# Patient Record
Sex: Female | Born: 1960 | Race: Black or African American | Hispanic: No | Smoking: Current every day smoker
Health system: Southern US, Community
[De-identification: ages and names within clinical notes are randomized; demographics above are authoritative.]

## PROBLEM LIST (undated history)

## (undated) DIAGNOSIS — B1920 Unspecified viral hepatitis C without hepatic coma: Secondary | ICD-10-CM

## (undated) DIAGNOSIS — K589 Irritable bowel syndrome without diarrhea: Secondary | ICD-10-CM

## (undated) DIAGNOSIS — J40 Bronchitis, not specified as acute or chronic: Secondary | ICD-10-CM

---

## 1998-11-06 ENCOUNTER — Emergency Department (HOSPITAL_COMMUNITY): Admission: EM | Admit: 1998-11-06 | Discharge: 1998-11-06 | Payer: Self-pay | Admitting: Emergency Medicine

## 1998-11-10 ENCOUNTER — Inpatient Hospital Stay (HOSPITAL_COMMUNITY): Admission: AD | Admit: 1998-11-10 | Discharge: 1998-11-10 | Payer: Self-pay | Admitting: *Deleted

## 1999-02-25 ENCOUNTER — Emergency Department (HOSPITAL_COMMUNITY): Admission: EM | Admit: 1999-02-25 | Discharge: 1999-02-25 | Payer: Self-pay | Admitting: Emergency Medicine

## 1999-03-20 ENCOUNTER — Emergency Department (HOSPITAL_COMMUNITY): Admission: EM | Admit: 1999-03-20 | Discharge: 1999-03-20 | Payer: Self-pay | Admitting: Emergency Medicine

## 1999-03-20 ENCOUNTER — Encounter: Payer: Self-pay | Admitting: Emergency Medicine

## 1999-03-26 ENCOUNTER — Encounter: Admission: RE | Admit: 1999-03-26 | Discharge: 1999-03-26 | Payer: Self-pay | Admitting: Family Medicine

## 1999-03-27 ENCOUNTER — Encounter: Payer: Self-pay | Admitting: Emergency Medicine

## 1999-03-27 ENCOUNTER — Emergency Department (HOSPITAL_COMMUNITY): Admission: EM | Admit: 1999-03-27 | Discharge: 1999-03-27 | Payer: Self-pay | Admitting: Emergency Medicine

## 1999-11-19 ENCOUNTER — Inpatient Hospital Stay (HOSPITAL_COMMUNITY): Admission: AD | Admit: 1999-11-19 | Discharge: 1999-11-22 | Payer: Self-pay | Admitting: Obstetrics & Gynecology

## 1999-11-19 ENCOUNTER — Encounter: Payer: Self-pay | Admitting: Obstetrics & Gynecology

## 1999-11-20 ENCOUNTER — Encounter: Payer: Self-pay | Admitting: Obstetrics

## 2000-10-12 ENCOUNTER — Encounter: Payer: Self-pay | Admitting: Internal Medicine

## 2000-10-12 ENCOUNTER — Ambulatory Visit (HOSPITAL_COMMUNITY): Admission: RE | Admit: 2000-10-12 | Discharge: 2000-10-12 | Payer: Self-pay | Admitting: Internal Medicine

## 2000-10-20 ENCOUNTER — Emergency Department (HOSPITAL_COMMUNITY): Admission: EM | Admit: 2000-10-20 | Discharge: 2000-10-20 | Payer: Self-pay | Admitting: Emergency Medicine

## 2000-12-01 ENCOUNTER — Ambulatory Visit (HOSPITAL_COMMUNITY): Admission: RE | Admit: 2000-12-01 | Discharge: 2000-12-01 | Payer: Self-pay | Admitting: Internal Medicine

## 2001-12-05 ENCOUNTER — Ambulatory Visit (HOSPITAL_COMMUNITY): Admission: RE | Admit: 2001-12-05 | Discharge: 2001-12-05 | Payer: Self-pay | Admitting: Family Medicine

## 2001-12-16 ENCOUNTER — Emergency Department (HOSPITAL_COMMUNITY): Admission: EM | Admit: 2001-12-16 | Discharge: 2001-12-17 | Payer: Self-pay | Admitting: Emergency Medicine

## 2001-12-17 ENCOUNTER — Encounter: Payer: Self-pay | Admitting: Emergency Medicine

## 2002-01-02 ENCOUNTER — Encounter: Payer: Self-pay | Admitting: Internal Medicine

## 2002-01-02 ENCOUNTER — Ambulatory Visit (HOSPITAL_COMMUNITY): Admission: RE | Admit: 2002-01-02 | Discharge: 2002-01-02 | Payer: Self-pay | Admitting: Internal Medicine

## 2002-03-16 ENCOUNTER — Encounter: Payer: Self-pay | Admitting: Neurosurgery

## 2002-03-16 ENCOUNTER — Ambulatory Visit (HOSPITAL_COMMUNITY): Admission: RE | Admit: 2002-03-16 | Discharge: 2002-03-17 | Payer: Self-pay | Admitting: Neurosurgery

## 2002-05-16 ENCOUNTER — Encounter: Payer: Self-pay | Admitting: Internal Medicine

## 2002-05-16 ENCOUNTER — Ambulatory Visit (HOSPITAL_COMMUNITY): Admission: RE | Admit: 2002-05-16 | Discharge: 2002-05-16 | Payer: Self-pay | Admitting: Internal Medicine

## 2002-05-19 ENCOUNTER — Encounter: Payer: Self-pay | Admitting: Internal Medicine

## 2002-05-19 ENCOUNTER — Ambulatory Visit (HOSPITAL_COMMUNITY): Admission: RE | Admit: 2002-05-19 | Discharge: 2002-05-19 | Payer: Self-pay | Admitting: Internal Medicine

## 2002-10-17 ENCOUNTER — Encounter: Admission: RE | Admit: 2002-10-17 | Discharge: 2002-10-24 | Payer: Self-pay | Admitting: Internal Medicine

## 2002-12-07 ENCOUNTER — Ambulatory Visit (HOSPITAL_COMMUNITY): Admission: RE | Admit: 2002-12-07 | Discharge: 2002-12-07 | Payer: Self-pay | Admitting: Family Medicine

## 2003-09-11 ENCOUNTER — Encounter: Admission: RE | Admit: 2003-09-11 | Discharge: 2003-09-11 | Payer: Self-pay | Admitting: Internal Medicine

## 2004-10-30 ENCOUNTER — Ambulatory Visit: Payer: Self-pay | Admitting: Internal Medicine

## 2004-11-18 ENCOUNTER — Encounter: Admission: RE | Admit: 2004-11-18 | Discharge: 2004-11-18 | Payer: Self-pay | Admitting: Internal Medicine

## 2005-01-07 ENCOUNTER — Ambulatory Visit: Payer: Self-pay | Admitting: Internal Medicine

## 2005-08-30 ENCOUNTER — Ambulatory Visit: Payer: Self-pay | Admitting: Internal Medicine

## 2005-11-22 ENCOUNTER — Ambulatory Visit: Payer: Self-pay | Admitting: Internal Medicine

## 2006-01-18 ENCOUNTER — Encounter: Admission: RE | Admit: 2006-01-18 | Discharge: 2006-01-18 | Payer: Self-pay | Admitting: Internal Medicine

## 2006-08-08 ENCOUNTER — Emergency Department (HOSPITAL_COMMUNITY): Admission: EM | Admit: 2006-08-08 | Discharge: 2006-08-08 | Payer: Self-pay | Admitting: Emergency Medicine

## 2007-07-05 ENCOUNTER — Ambulatory Visit: Payer: Self-pay | Admitting: Internal Medicine

## 2007-07-07 ENCOUNTER — Ambulatory Visit (HOSPITAL_COMMUNITY): Admission: RE | Admit: 2007-07-07 | Discharge: 2007-07-07 | Payer: Self-pay | Admitting: Internal Medicine

## 2007-07-13 ENCOUNTER — Ambulatory Visit: Payer: Self-pay | Admitting: Internal Medicine

## 2008-01-19 ENCOUNTER — Inpatient Hospital Stay (HOSPITAL_COMMUNITY): Admission: AD | Admit: 2008-01-19 | Discharge: 2008-01-19 | Payer: Self-pay | Admitting: Obstetrics & Gynecology

## 2008-06-29 ENCOUNTER — Emergency Department (HOSPITAL_COMMUNITY): Admission: EM | Admit: 2008-06-29 | Discharge: 2008-06-29 | Payer: Self-pay | Admitting: Emergency Medicine

## 2008-07-18 ENCOUNTER — Ambulatory Visit: Payer: Self-pay | Admitting: Family Medicine

## 2009-03-14 ENCOUNTER — Ambulatory Visit: Payer: Self-pay | Admitting: Internal Medicine

## 2009-04-02 ENCOUNTER — Ambulatory Visit: Payer: Self-pay | Admitting: Internal Medicine

## 2009-04-02 LAB — CONVERTED CEMR LAB
CO2: 18 meq/L — ABNORMAL LOW (ref 19–32)
Cholesterol: 247 mg/dL — ABNORMAL HIGH (ref 0–200)
Glucose, Bld: 84 mg/dL (ref 70–99)
HDL: 80 mg/dL (ref >39)
LDL Cholesterol: 145 mg/dL — ABNORMAL HIGH (ref 0–99)
Potassium: 4.3 meq/L (ref 3.5–5.3)
Sodium: 142 meq/L (ref 135–145)

## 2009-04-10 ENCOUNTER — Ambulatory Visit (HOSPITAL_COMMUNITY): Admission: RE | Admit: 2009-04-10 | Discharge: 2009-04-10 | Payer: Self-pay | Admitting: Internal Medicine

## 2010-03-16 ENCOUNTER — Emergency Department (HOSPITAL_COMMUNITY)
Admission: EM | Admit: 2010-03-16 | Discharge: 2010-03-16 | Payer: Self-pay | Source: Home / Self Care | Admitting: Emergency Medicine

## 2010-04-22 ENCOUNTER — Ambulatory Visit (HOSPITAL_COMMUNITY): Admission: RE | Admit: 2010-04-22 | Payer: Self-pay | Source: Home / Self Care | Admitting: Internal Medicine
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
## 2010-08-28 NOTE — Op Note (Signed)
NAME:  [name], [name]                   ACCOUNT NO.:  [id number]   MEDICAL RECORD NO.:  [id number]                   PATIENT TYPE:  OIB   LOCATION:  3008                                 FACILITY:  [facility]   PHYSICIAN:  Kathaleen Maser. Pool, M.D.                 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   POSTOPERATIVE DIAGNOSES:  1. Right L4-5 herniated nucleus pulposus with radiculopathy.  2. 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   PROCEDURE:  Right L4-5 laminotomy and microdiskectomy.   SURGEON:  Kathaleen Maser. Pool, M.D.   ASSISTANT:  Reinaldo Meeker, M.D.   ANESTHESIA:  General endotracheal.   INDICATION FOR PROCEDURE:  The patient is a 49 year old female with history  of back and right lower extremity pain, paresthesias, and weakness with a  right-sided L5 radiculopathy, which has failed conservative management.  MRI  scanning demonstrates evidence of a small focal right-sided L4-5 disk  herniation with compression of the proximal aspect of the right-sided L5  nerve root.  The patient has been counseled as to her options.  She has  decided to proceed with a right-sided L4-5 laminotomy and microdiskectomy  for hopeful improvement of her symptoms.   DESCRIPTION OF PROCEDURE:  The patient was taken to the operating room and  placed on the operating table in a supine position.  After an adequate level  of anesthesia was achieved, the patient was positioned prone onto the Wilson  frame, appropriately padded.  The patient's lumbar region was prepped and  draped sterilely.  A 10 blade was used to make a linear skin incision  overlying the L4-5 interspace.  This was carried down sharply in the  midline.  A subperiosteal dissection was then performed, exposing the  laminae and facet joints of L4 and L5 on  the right.  A deep self-retaining  retractor was placed, intraoperative x-ray was taken, and the level was  confirmed.  A laminotomy was then performed using a high-speed drill and  Kerrison rongeurs to remove the inferior one-half of the lamina of L4,  medial edge of the L4-5 facet joint, and superior rim of the L5 lamina.  The  ligamentum flavum was then elevated and resected in piecemeal fashion using  Kerrison rongeurs until the underlying thecal sac was identified.  The  microscope was brought into the field and using microdissection of the right-  sided nerve root and underlying disk herniation.  During dissection along  the right-sided L5 nerve root, it was obvious that the anatomy was abnormal.  This was dissected out further, and it became clear that the patient had a  conjoined nerve root sleeve involving the L4 and L5 nerve roots.  The space  between the two nerve roots was developed  and the nerve roots themselves  were retracted gently toward the midline.  The focal disk herniation was  readily apparent.  This was then incised with a 15 blade in a rectangular  fashion.  A wide disk space cleanout was then achieved using pituitary  rongeurs, upward-angled pituitary rongeurs, and Epstein curettes.  All loose  or obviously degenerative disk material was removed from the interspace.  All elements of the disk herniation were completely resected.  At this point  both the L4 and L5 nerve roots were free of any compression.  The wound was  then irrigated with antibiotic solution.  Gelfoam was placed topically for  hemostasis, which was found to be good.  The microscope and retractor system  were removed.  Hemostasis in the muscle was achieved with electrocautery.  The wound was then closed in layers with Vicryl sutures.  Steri-Strips and a  sterile dressing were applied. There were no apparent complications.  The  patient tolerated the procedure well, and she returns to the recovery  room  postop.                                                Henry A. Pool, M.D.    HAP/MEDQ  D:  03/16/2002  T:  03/16/2002  Job:  [id number]

## 2010-08-28 NOTE — Discharge Summary (Signed)
[facility]  Patient:    [name], [name]                  MRN: [id number] Adm. Date:  [id number] Disc. Date: [id number] Attending:  Conley Simmonds A                           Discharge Summary  ADMISSION DIAGNOSIS:          Abdominal pain.  DISCHARGE DIAGNOSES:          1. Abdominal pain.                               2. Pelvic abscess.  HISTORY OF PRESENT ILLNESS:   The patient is a 50 year old, gravida 2, para 1, status post TAH and LSO in April of 2001 at [facility] for fibroids and ovarian cyst.  She presented with onset of right abdominal pain on November 18, 1999, that morning, which had been steadily worsening.  She reported that the pain was 8/10 and worse with increasing activity and hurts after she voids. She denied any fever.  She did report some nausea, but denied any vomiting. Per patient, she was in the hospital for approximately three weeks after  her surgery in April at [facility]  Her postoperative course was complicated by what sounds like a postoperative ileus, as well as a postoperative cuff abscess, which was drained, and she was on antibiotics for a prolonged period of time.  PHYSICAL EXAMINATION:         The patient was noted on admission to be afebrile with stable vital signs.  Her physical exam on admission was remarkable for some right lower quadrant tenderness to light palpation.  No rebound, but some guarding.  There was no palpable mass.  However, on bimanual exam, there was a 7-8 cm right adnexal mass which was tender and nonmobile.  LABORATORY DATA:              Her white blood cell count was 8.5 on admission and the hematocrit was 41.2.  The wet prep was negative.  Her urinalysis was also negative.  A pelvic ultrasound revealed a 7.7 cm multiloculated cyst in the right adnexa, which was considered to be an abscess versus a neoplasm.  HOSPITAL COURSE:              The patient was admitted, given  pain medications, and initially placed NPO.  Because the patients initial surgery was performed at [facility], the gynecologic service was contacted there and they desired the patient to be transferred to their service.  The patient was transferred on hospital day #3.  The delay was secondary to waiting for an available bed at [facility].  During her entire hospitalization, the patient remained afebrile with stable vital signs.  Her white count did not become elevated.  She was actually maintained on oral pain medications.  DISPOSITION:                  The patient was transferred on hospital day #3 to the care of the GYN service of Dr. ______ with the diagnosis of probable right adnexal abscess.  She was in stable condition at the time of transfer. DD:  12/15/99 TD:  12/15/99 Job: [id number]

## 2011-01-11 LAB — COMPREHENSIVE METABOLIC PANEL
Albumin: 3.8
BUN: 10
CO2: 27
Calcium: 9.4
Glucose, Bld: 92
Total Protein: 7.1

## 2011-01-11 LAB — URINALYSIS, ROUTINE W REFLEX MICROSCOPIC
Glucose, UA: NEGATIVE
Ketones, ur: NEGATIVE
Leukocytes, UA: NEGATIVE
Protein, ur: NEGATIVE
Specific Gravity, Urine: 1.015
Urobilinogen, UA: 0.2

## 2011-01-11 LAB — WET PREP, GENITAL: Yeast Wet Prep HPF POC: NONE SEEN

## 2011-01-11 LAB — GC/CHLAMYDIA PROBE AMP, GENITAL: Chlamydia, DNA Probe: NEGATIVE

## 2011-01-11 LAB — CBC
HCT: 39.2
MCV: 94.4
RBC: 4.15

## 2011-01-11 LAB — URINE MICROSCOPIC-ADD ON

## 2011-01-11 LAB — LIPASE, BLOOD: Lipase: 30

## 2011-01-28 ENCOUNTER — Inpatient Hospital Stay (INDEPENDENT_AMBULATORY_CARE_PROVIDER_SITE_OTHER): Admit: 2011-01-28 | Discharge: 2011-01-28 | Disposition: A | Discharge status: Home / Self Care | Payer: Self-pay

## 2011-01-28 ENCOUNTER — Emergency Department (HOSPITAL_COMMUNITY)
Admission: EM | Admit: 2011-01-28 | Discharge: 2011-01-28 | Discharge status: Against Medical Advice | Payer: Self-pay | Attending: Emergency Medicine | Admitting: Emergency Medicine

## 2011-01-28 DIAGNOSIS — R079 Chest pain, unspecified: Secondary | ICD-10-CM | POA: Insufficient documentation

## 2011-01-28 DIAGNOSIS — R071 Chest pain on breathing: Secondary | ICD-10-CM

## 2011-01-28 DIAGNOSIS — J40 Bronchitis, not specified as acute or chronic: Secondary | ICD-10-CM

## 2011-02-02 ENCOUNTER — Inpatient Hospital Stay (HOSPITAL_COMMUNITY)
Admission: EM | Admit: 2011-02-02 | Discharge: 2011-02-04 | DRG: 195 | Disposition: A | Discharge status: Home / Self Care | Payer: Self-pay | Source: Ambulatory Visit | Attending: Internal Medicine | Admitting: Internal Medicine

## 2011-02-02 DIAGNOSIS — F411 Generalized anxiety disorder: Secondary | ICD-10-CM | POA: Diagnosis present

## 2011-02-02 DIAGNOSIS — B1920 Unspecified viral hepatitis C without hepatic coma: Secondary | ICD-10-CM | POA: Diagnosis present

## 2011-02-02 DIAGNOSIS — R071 Chest pain on breathing: Secondary | ICD-10-CM | POA: Diagnosis present

## 2011-02-02 DIAGNOSIS — J189 Pneumonia, unspecified organism: Principal | ICD-10-CM | POA: Diagnosis present

## 2011-02-02 DIAGNOSIS — F17200 Nicotine dependence, unspecified, uncomplicated: Secondary | ICD-10-CM | POA: Diagnosis present

## 2011-02-02 DIAGNOSIS — K589 Irritable bowel syndrome without diarrhea: Secondary | ICD-10-CM | POA: Diagnosis present

## 2011-02-02 HISTORY — DX: Irritable bowel syndrome, unspecified: K58.9

## 2011-02-02 HISTORY — DX: Unspecified viral hepatitis C without hepatic coma: B19.20

## 2011-02-02 HISTORY — DX: Bronchitis, not specified as acute or chronic: J40

## 2011-02-03 ENCOUNTER — Encounter (HOSPITAL_COMMUNITY): Payer: Self-pay | Admitting: Radiology

## 2011-02-03 ENCOUNTER — Emergency Department (HOSPITAL_COMMUNITY): Payer: Self-pay

## 2011-02-03 LAB — DIFFERENTIAL
Eosinophils Absolute: 0.2 10*3/uL (ref 0.0–0.7)
Eosinophils Relative: 2 % (ref 0–5)
Lymphs Abs: 2.7 10*3/uL (ref 0.7–4.0)
Monocytes Absolute: 1.2 10*3/uL — ABNORMAL HIGH (ref 0.1–1.0)
Neutro Abs: 4.9 10*3/uL (ref 1.7–7.7)

## 2011-02-03 LAB — POCT I-STAT TROPONIN I

## 2011-02-03 LAB — CBC
Hemoglobin: 12 g/dL (ref 12.0–15.0)
MCH: 31 pg (ref 26.0–34.0)
MCV: 89.1 fL (ref 78.0–100.0)
Platelets: 319 10*3/uL (ref 150–400)
RBC: 3.87 MIL/uL (ref 3.87–5.11)
RDW: 12.6 % (ref 11.5–15.5)

## 2011-02-03 LAB — POCT I-STAT, CHEM 8
Calcium, Ion: 1.16 mmol/L (ref 1.12–1.32)
Chloride: 106 mEq/L (ref 96–112)
Glucose, Bld: 104 mg/dL — ABNORMAL HIGH (ref 70–99)
Hemoglobin: 12.2 g/dL (ref 12.0–15.0)
Potassium: 3.6 mEq/L (ref 3.5–5.1)
Sodium: 140 mEq/L (ref 135–145)

## 2011-02-03 LAB — LIPID PANEL: HDL: 39 mg/dL — ABNORMAL LOW (ref >39)

## 2011-02-03 LAB — HEPATIC FUNCTION PANEL
ALT: 10 U/L (ref 0–35)
AST: 18 U/L (ref 0–37)
Albumin: 3.5 g/dL (ref 3.5–5.2)
Alkaline Phosphatase: 130 U/L — ABNORMAL HIGH (ref 39–117)
Bilirubin, Direct: <0.1 mg/dL (ref 0.0–0.3)
Total Bilirubin: 0.1 mg/dL — ABNORMAL LOW (ref 0.3–1.2)
Total Protein: 7.9 g/dL (ref 6.0–8.3)

## 2011-02-03 LAB — CARDIAC PANEL(CRET KIN+CKTOT+MB+TROPI)
Relative Index: 1.4 (ref 0.0–2.5)
Relative Index: 1.5 (ref 0.0–2.5)
Relative Index: 1.1 (ref 0.0–2.5)
Total CK: 126 U/L (ref 7–177)
Troponin I: <0.3 ng/mL (ref <0.30)

## 2011-02-03 LAB — CK TOTAL AND CKMB (NOT AT ARMC)
CK, MB: 1.5 ng/mL (ref 0.3–4.0)
Total CK: 143 U/L (ref 7–177)

## 2011-02-03 LAB — HEPATITIS PANEL, ACUTE
Hep B C IgM: NEGATIVE
Hepatitis B Surface Ag: NEGATIVE

## 2011-02-03 MED ORDER — IOHEXOL 350 MG/ML SOLN
100.0000 mL | Freq: Once | INTRAVENOUS | Status: AC | PRN
  Administered 2011-02-03: 100 mL via INTRAVENOUS

## 2011-02-04 LAB — BASIC METABOLIC PANEL
BUN: 5 mg/dL — ABNORMAL LOW (ref 6–23)
CO2: 22 mEq/L (ref 19–32)
Chloride: 111 mEq/L (ref 96–112)
Glucose, Bld: 100 mg/dL — ABNORMAL HIGH (ref 70–99)
Potassium: 3.8 mEq/L (ref 3.5–5.1)

## 2011-02-04 LAB — CBC
HCT: 34.8 % — ABNORMAL LOW (ref 36.0–46.0)
Hemoglobin: 11.6 g/dL — ABNORMAL LOW (ref 12.0–15.0)
MCHC: 33.3 g/dL (ref 30.0–36.0)
RBC: 3.84 MIL/uL — ABNORMAL LOW (ref 3.87–5.11)
WBC: 4.9 10*3/uL (ref 4.0–10.5)

## 2011-02-04 NOTE — H&P (Signed)
NAMEJAYCEE, [name]         ACCOUNT NO.:  [id number]  MEDICAL RECORD NO.:  [id number]  LOCATION:  [facility]                         FACILITY:  [facility]  PHYSICIAN:  Osvaldo Shipper, MD     DATE OF BIRTH:  [birth year]  DATE OF ADMISSION:  02/03/2011 DATE OF DISCHARGE:                             HISTORY & PHYSICAL   PRIMARY CARE PHYSICIAN:  [facility], although she has not been to their clinic in over a year.  ADMISSION DIAGNOSES: 1. Right-sided pleuritic chest pain, etiology not entirely clear. 2. Questionable pneumonia. 3. History of hepatitis B. 4. History of irritable bowel syndrome.  CHIEF COMPLAINT:  Right-sided chest pain for a week.  HISTORY OF PRESENT ILLNESS:  The patient is a 50 year old African American female who has a history of hepatitis B and irritable bowel syndrome both of which are really stable at this point, who was in her usual state of health about a week ago when she was at a band at her friend's place and started having sudden onset of right-sided chest pain.  The pain was under her right breast and somewhat radiating along the side of her chest to the back.  She had some cough which had initially greenish expectoration, subsequently yellow and then followed by clear.  Denies any blood in the expectorant.  She came back to [place] on Thursday.  Her son took her to the [facility] Emergency Department.  They did an EKG which apparently did not show anything acute and then before waiting to see the providers they decided to leave and go to Urgent Care.  In the Urgent Care Center, the patient was diagnosed with bronchitis.  She was prescribed inhaler. cough syrup. Z- Pak, and Motrin.  She did not undergo any chest x-rays at that time. She took this medication, stayed at home, the pain got worse and so he decided to bring the patient back to the emergency department at [facility]. Denies any leg swelling.  She has been having some hot and cold feeling, some  chills, but denies any fever per se.  The pain is sharp pain, increases with deep breathing and with cough.  There is no real relieving factors.  The pain is 10/10 in intensity.  She has never had similar symptoms before.  She did have one episode of nausea and vomiting, but none after that.  Denies any skin rashes.  Denies any weight loss.  She said that she went to [place] by road in June and in September around Labor Day weekend.  MEDICATIONS AT HOME:  None on a scheduled basis.  ALLERGIES:  She has been told that she should not take TYLENOL.  PAST MEDICAL HISTORY:  Positive for allergies, bronchitis, and hepatitis B.  There is also mention of hepatitis C, although the patient did not mention this and then there is history of irritable bowel syndrome which the patient says that has not been bothering her recently.  SURGICAL HISTORY:  Hysterectomy.  SOCIAL HISTORY:  She lives in [place].  She works as a Lawyer.  She smokes 3 cigarettes on a daily basis.  Occasional beer intake.  Denies any illicit drug use.  Independent with her daily activities.  FAMILY HISTORY:  Positive  for mother who died of an unknown rare cancer. Father died of emphysema.  There is also history of hypertension, diabetes, and peripheral artery disease in the family.  REVIEW OF SYSTEMS:  GENERAL:  Positive for weakness and malaise.  HEENT: Unremarkable.  CARDIOVASCULAR:  As in HPI.  RESPIRATORY:  As in HPI. GI:  Unremarkable.  GU:  Unremarkable.  NEUROLOGIC:  Unremarkable. PSYCHIATRIC:  Unremarkable.  DERMATOLOGICAL:  Unremarkable.  Other systems reviewed and found to be negative.  PHYSICAL EXAMINATION:  VITAL SIGNS:  Temperature initially when she came in was 100.4, subsequently 98.1.  Blood pressure 120/67, heart rate 83, respiratory rate 22, and saturation 96% on room air. GENERAL:  This is a thin African American female, in no distress. HEENT:  Head is normocephalic and atraumatic.  Pupils are equal  and reacting.  No pallor.  No icterus.  Oral mucous membrane is moist.  No oral lesions are noted. NECK:  Soft and supple.  No thyromegaly is appreciated. LUNGS:  Decreased air entry at the right side.  Few crackles are present on that side.  There are a few crackles in the left base as well.  No wheezing is present at this time.  No rhonchi is heard. CARDIOVASCULAR:  S1 and S2 are normal.  Regular.  No S3, S4, rubs, murmurs, or bruits.  No pedal edema.  No JVD. ABDOMEN:  Soft, nontender, and nondistended.  Bowel sounds are present. No masses or organomegaly is appreciated. GU:  Deferred. MUSCULOSKELETAL:  Normal muscle mass and tone. SKIN:  No obvious rashes seen under the right breast area or in the same dermatome in the back.  Palpation of the right chest area did not reproduce the pain. NEUROLOGIC:  She is alert and oriented x3.  No focal neurological deficits are present.  LAB DATA:  Her white cell count is normal, hemoglobin is 12.2, and platelet count is 319.  Her electrolytes are normal.  Renal function is normal.  Troponin is 0.01.  IMAGING STUDIES:  She had a chest x-ray today which showed mild bibasilar air space opacities, right greater than left, raised concern for mild pneumonia.  EKG was done, which shows a sinus rhythm at 71 beats per minute with normal axis, intervals appear to be in the normal range, no Q-waves, no concerning ST or T segment changes are noted.  Inverted Ts are seen occasionally.  Compared to EKG from just a few days ago, there is not much appreciable change.  ASSESSMENT:  This is a 50 year old African American female with medical history as stated earlier who presents with pleuritic right-sided chest pain.  She has completed a course of azithromycin for presumed bronchitis.  Chest x-ray raises concern about pneumonia.  Looking at the x-ray, her pain is out of proportion to the findings on the chest x-ray. So, at this time, I think to further  characterize her lesions and the findings on the chest x-ray, we will need to proceed with a CT angio. This is also to make sure she does not have any other pathology causing her to have significant pain.  Differential diagnosis include venous thromboembolism.  PLAN: 1. Pleuritic chest pain.  Proceed with CT angio of chest.  Give her     NSAIDs as tolerated.  Cardiac enzymes will be cycled, however, this     does not appear to be cardiac in nature. 2. Questionable pneumonia.  She has recently completed a course of     azithromycin.  We will put her  on Avelox for now and await the     results of the CT. 3. History of hepatitis B.  We will proceed with the LFTs and with     hepatitis panel.  DVT prophylaxis will be initiated.  Further management decisions will depend on results of further testing and patient's response.  Please note that the patient will require reestablishment of care with [facility].     Osvaldo Shipper, MD     GK/MEDQ  D:  02/03/2011  T:  02/03/2011  Job:  [id number]  Electronically Signed by Osvaldo Shipper MD on 02/04/2011 07:33:03 PM

## 2011-02-12 NOTE — Discharge Summary (Signed)
NAMECHANTI, [name]         ACCOUNT NO.:  [id number]  MEDICAL RECORD NO.:  [id number]  LOCATION:  2009                         FACILITY:  [facility]  PHYSICIAN:  Rosanna Randy, MDDATE OF BIRTH:  1960-07-15  DATE OF ADMISSION:  02/02/2011 DATE OF DISCHARGE:  02/04/2011                              DISCHARGE SUMMARY   DISCHARGE DIAGNOSES: 1. Community-acquired pneumonia. 2. Right-sided pleuritic chest pain secondary to problem #1. 3. Tobacco abuse. 4. Remote history of hepatitis B. 5. History of hepatitis C. 6. History of irritable bowel syndrome. 7. History of anxiety.  DISCHARGE MEDICATIONS: 1. Mucinex 600 mg by mouth twice a day. 2. Avelox 400 mg by mouth daily for 8 more days. 3. Nicotine patch 14 mg transdermally every 24 hours.  PROCEDURE PERFORMED DURING THIS HOSPITALIZATION:  CT angio of the chest February 03, 2011, that demonstrated no evidence of pulmonary embolism, patchy airspace opacification noted at both lung bases, right greater than left, concerning for pneumonia, scattered blebs noted at the lung bases with a mild associated scarring, mild interstitial prominence without definite edema.  There is a prominent gastroesophageal reflux node measuring 1.3 cm, prominent right hilar and peribronchial node also seen that may reflect underlying pneumonia.  Chest x-rayed two-views demonstrated mild bibasilar airspace opacities, right greater than left, raising concern for mild pneumonia.  No other procedures were performed during this hospitalization.  No consultations were made during this admission.  HISTORY OF PRESENT ILLNESS:  For full details, please refer to dictation done by Dr. Rito Ehrlich, but briefly this is a 50 year old African American female who had a history in the past of hepatitis B, hepatitis C, and irritable bowel syndrome who was in her usual state of health about a week ago when she was at a van at her friend's place and started  having sudden onset of right-sided chest pain.  The pain was under her right breast and somewhat radiating along the side of her chest to the back. She has some cough, which had initially greenish expectoration, subsequently yellow, and then followed by clear expectoration.  The patient denies any blood in the expectoration.  No fever, no chills were described by this patient.  Temperature on admission 100.4.  X-ray and also CT angio demonstrated findings consistent with bibasilar pneumonia. The patient has been placed on antibiotics at the moment of admission.  PERTINENT LABORATORY DATA:  We have I-STAT chemistry with a bicarb 22, ionized calcium 1.16, hemoglobin 12.2, hematocrit 36.0.  Sodium 140, potassium 3.6, chloride 106, bicarb 104, BUN 5, creatinine 0.70. Troponin 0.01.  Cardiac markers negative x3.  CBC with differential; white blood cells 8.9, hemoglobin 12, and platelets 319.  Hepatic function panel with a bilirubin of 0.1, alkaline phosphatase 130, AST 18, ALT 10, total protein 7.9, albumin 3.5.  Lipid profile demonstratedtotal triglycerides of 130, total cholesterol of 171, HDL 39, LDL 106. Hepatitis panel ordered during this admission demonstrated negative hepatitis B surface antigen.  Negative hepatitis B core antigen IgM. Negative hepatitis A antibody IgA and also a negative hepatitis C antibody.  At discharge, the patient had a CBC with a white blood cells of 4.9, hemoglobin 12, platelets 335 and BMET demonstrated a sodium of 143, potassium 3.8, chloride  111, bicarb 22, glucose 100, BUN 5, creatinine 0.63, calcium 9.4.  HOSPITAL COURSE BY PROBLEM: 1. The patient's chest pain is pleuritic in nature secondary to     community-acquired pneumonia.  At this point, we are going to     continue antibiotic therapy to complete 8 more days of Avelox 400     mg once a day and also will use Mucinex to control her cough and     help with expectoration.  The patient will be using  Tylenol for     pain control and comfort.  She is going to arrange followup with     primary care physician as an outpatient for further followup of her     symptoms. 2. Tobacco abuse.  The patient has been placed on nicotine patch and     she also received smoking cessation counseling during this     admission. 3. Anxiety.  Currently not taking any medications.  She is going to     follow with primary care physician as an outpatient, and further     treatment if needed will be started and followed by PCP. 4. History of hepatitis B and hepatitis C.  Currently not active.  She     will continue following as an outpatient.  No further treatment or     workup needed at this point. 5. Irritable bowel syndrome, currently without any diarrhea or     constipation.  The patient will continue just following her diet.  PHYSICAL EXAMINATION:  VITAL SIGNS:  At discharge, temperature was 98.4, heart rate 76, respiratory rate 20, blood pressure 108/64, oxygen saturation 98% on room air. GENERAL:  The patient was in no acute distress, having some scattered cough throughout examination. RESPIRATORY:  With good air movement and just a scattered rhonchi. HEART:  Regular rate and rhythm.  No murmurs, gallops, or rubs. ABDOMEN:  Soft, nontender with positive bowel sounds. EXTREMITIES:  No edema. NEUROLOGIC:  Nonfocal.     Rosanna Randy, MD     CEM/MEDQ  D:  02/04/2011  T:  02/04/2011  Job:  [id number]  cc:   Clinic [facility]  Electronically Signed by Vassie Loll MD on 02/12/2011 10:34:28 PM

## 2013-07-17 IMAGING — CT CT ANGIO CHEST
2 of 7 series · 18 of 46 positions shown · IV contrast (APPLIED)
Comparison: Chest radiograph performed earlier today at [DATE] a.m.

CLINICAL DATA: Right-sided chest pain on inspiration.  Assess for
pulmonary embolus.

CT ANGIOGRAPHY CHEST WITH CONTRAST
TECHNIQUE: Multidetector CT imaging of the chest was performed
using the standard protocol during bolus administration of
intravenous contrast.  Multiplanar CT image reconstructions
including MIPs were obtained to evaluate the vascular anatomy.
Contrast: 100mL OMNIPAQUE IOHEXOL 350 MG/ML IV SOLN

[Series 8: pulm embolism 1.0 b25f thin · axial · 0.70mm/px · z∈[-307,-38]mm · 15 of 301 slices shown]
[im 16/301  lung]
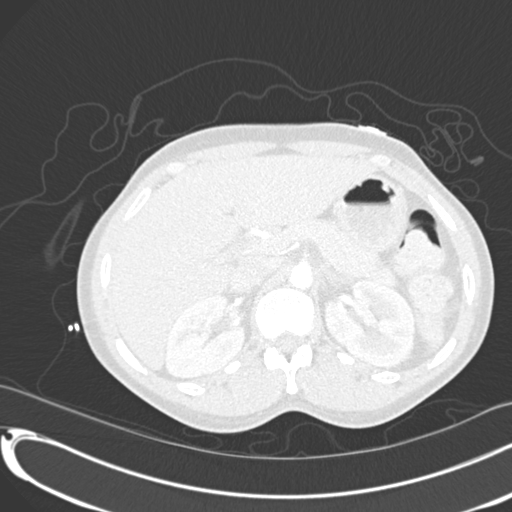
[im 32/301  soft-tissue]
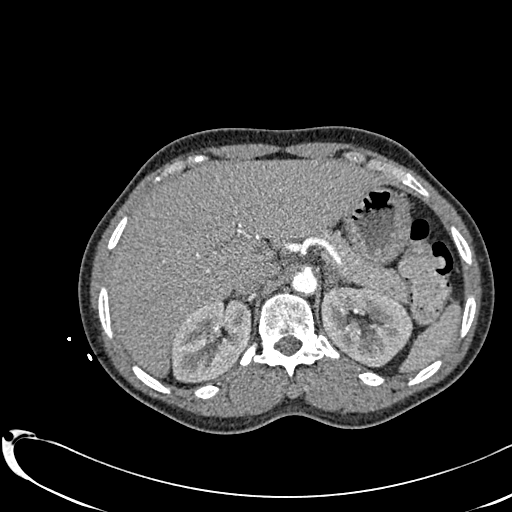
[im 64/301  lung]
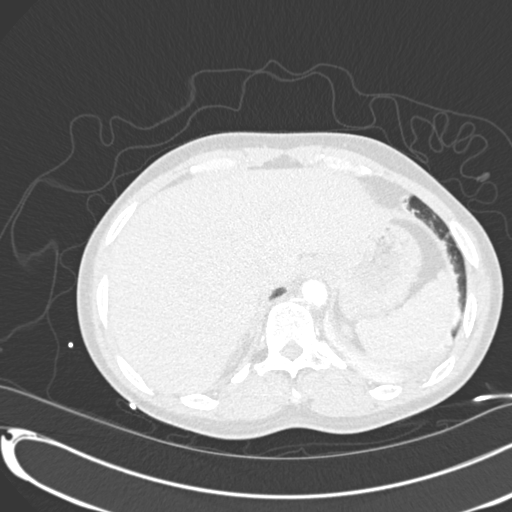
[im 79/301  soft-tissue]
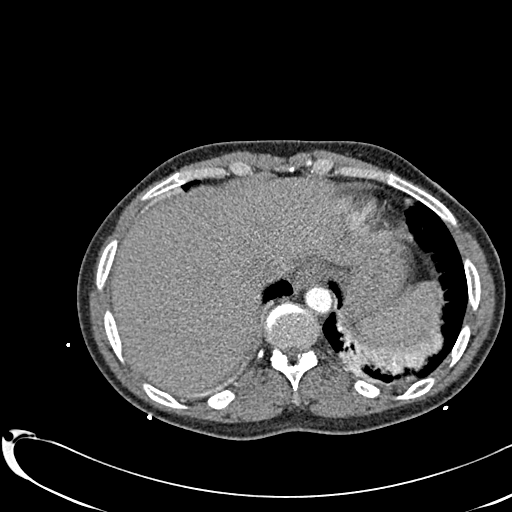
[im 95/301  lung]
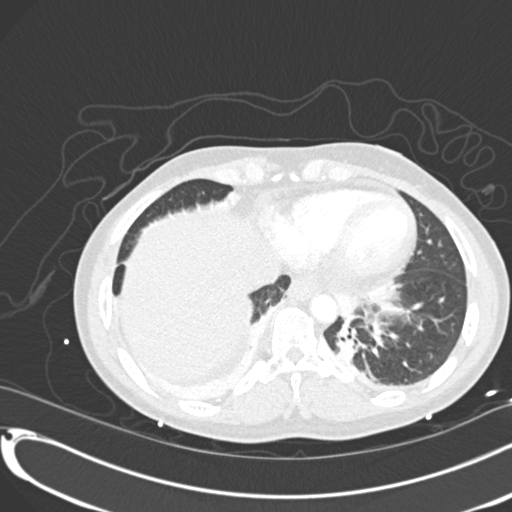
[im 111/301  soft-tissue]
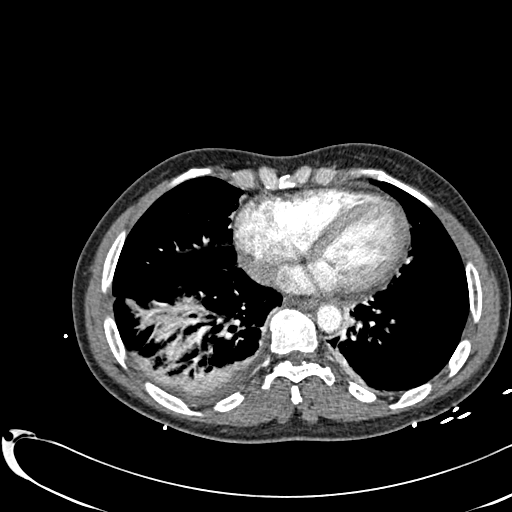
[im 127/301  lung]
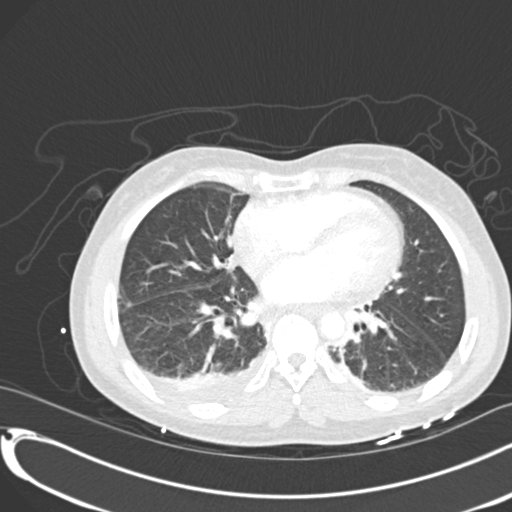
[im 158/301  soft-tissue]
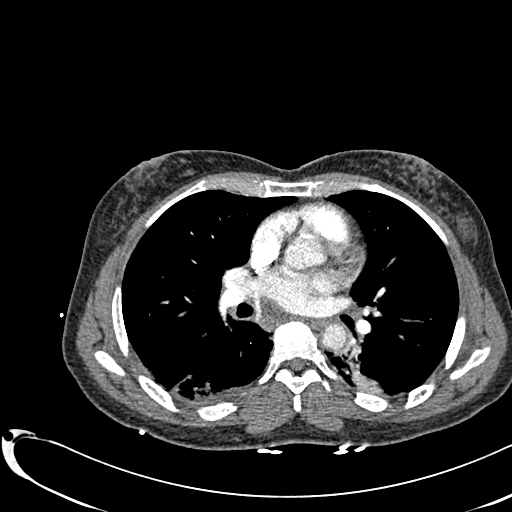
[im 174/301  lung]
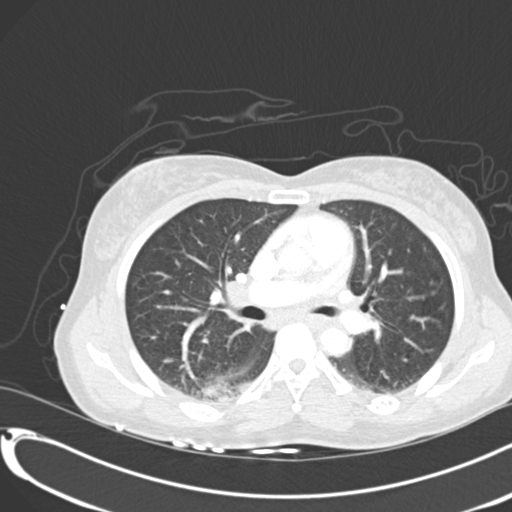
[im 190/301  soft-tissue]
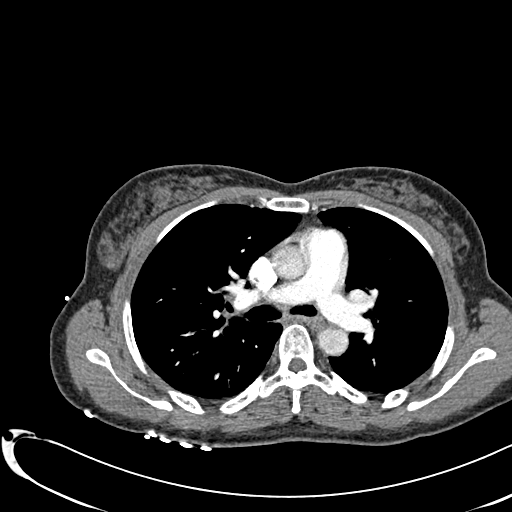
[im 206/301  lung]
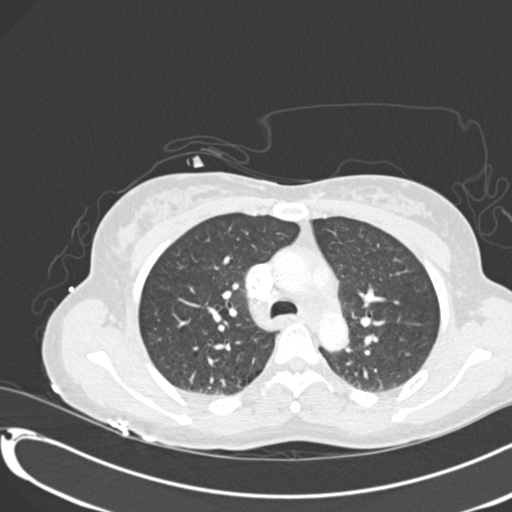
[im 222/301  soft-tissue]
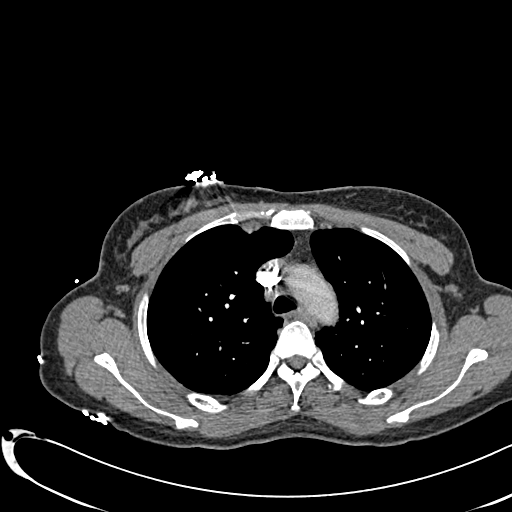
[im 253/301  lung]
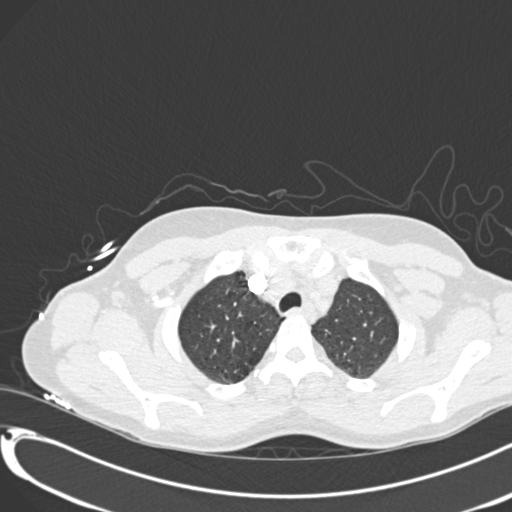
[im 269/301  soft-tissue]
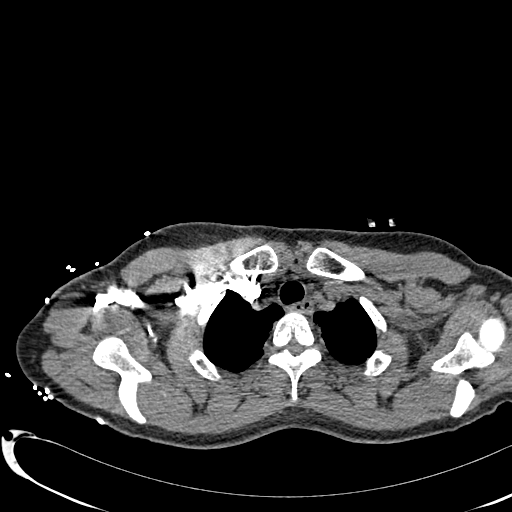
[im 285/301  lung]
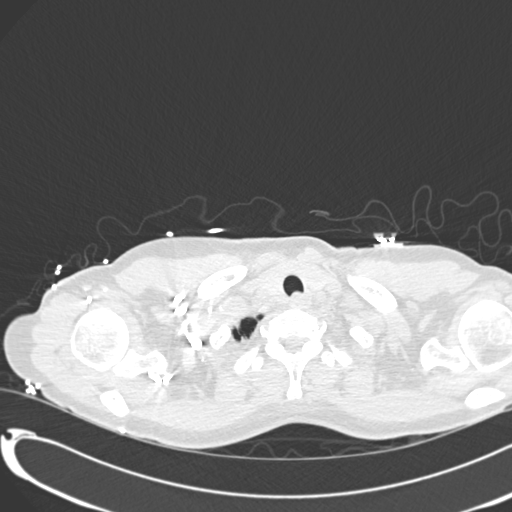

[Series 602: coronal mpr · coronal · 0.70mm/px · 3 of 95 slices shown]
[im 24/95  soft-tissue]
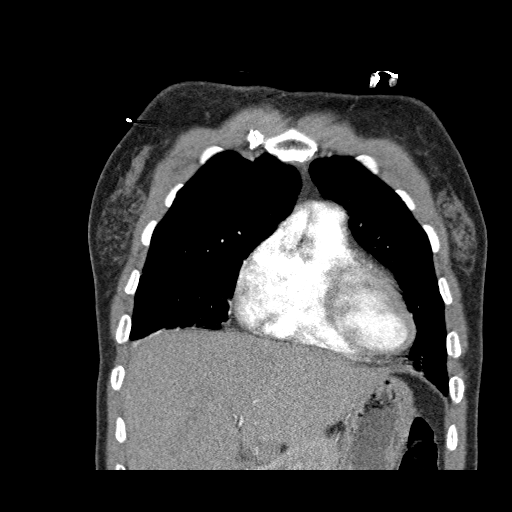
[im 48/95  soft-tissue]
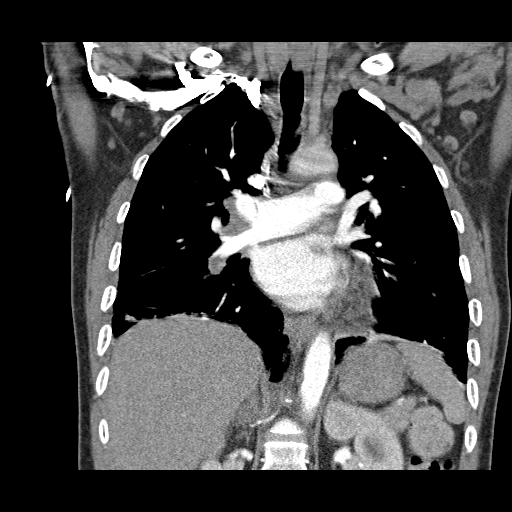
[im 71/95  soft-tissue]
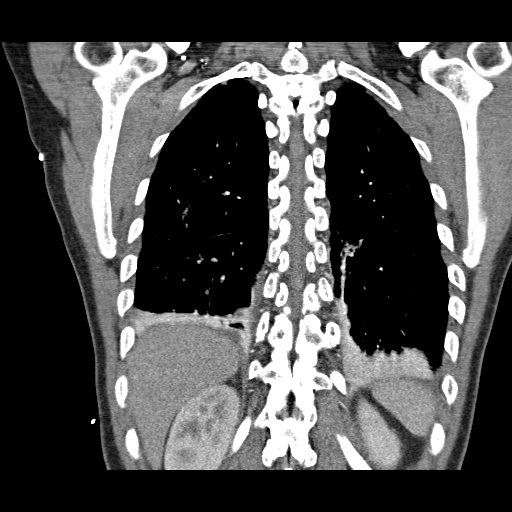

[18 of 46 positions shown; findings below may reference images not displayed]

FINDINGS: There is no evidence of significant pulmonary embolus.
Evaluation for pulmonary embolus is suboptimal in areas of airspace
consolidation.

Patchy airspace opacification is noted at both lung bases, right
greater than left, concerning for pneumonia.  Associated trace
bilateral pleural effusions are noted, also greater on the right
side.  Scattered blebs are seen at the lung apices, with mild
associated scarring.  There is mild prominence of the interstitium,
without definite evidence of pulmonary edema.  There is no evidence
of pneumothorax.  No masses are identified; no abnormal focal
contrast enhancement is seen.

A prominent azygoesophageal recess node is noted, measuring 1.3 cm
in short axis.  There are also several prominent right hilar and
peribronchial nodes, measuring up to 1.2 cm in short axis.  This
may reflect the underlying infection.  No pericardial effusion is
seen.  The great vessels are unremarkable in appearance.  No
axillary lymphadenopathy is seen.  The visualized portions of the
thyroid gland are unremarkable in appearance.

The visualized portions of the liver and spleen are unremarkable.
The visualized portions of the pancreas, stomach, adrenal glands
and kidneys are within normal limits.

No acute osseous abnormalities are seen.

Review of the MIP images confirms the above findings.
IMPRESSION: 1.  No evidence of significant pulmonary embolus.
2.  Patchy airspace opacification noted at both lung bases, right
greater than left, concerning for pneumonia.  Associated trace
bilateral pleural effusions, also more prominent on the right side.
3.  Scattered blebs noted at the lung apices, with mild associated
scarring.
4.  Mild interstitial prominence, without definite edema.
5.  Prominent azygoesophageal recess node, measuring 1.3 cm;
prominent right hilar and peribronchial nodes also seen.  This may
reflect the underlying pneumonia.

## 2021-12-25 ENCOUNTER — Emergency Department (HOSPITAL_COMMUNITY): Payer: Self-pay

## 2021-12-25 ENCOUNTER — Other Ambulatory Visit: Payer: Self-pay

## 2021-12-25 ENCOUNTER — Encounter (HOSPITAL_COMMUNITY): Payer: Self-pay | Admitting: *Deleted

## 2021-12-25 ENCOUNTER — Emergency Department (HOSPITAL_COMMUNITY)
Admission: EM | Admit: 2021-12-25 | Discharge: 2021-12-25 | Disposition: A | Discharge status: Home / Self Care | Payer: Self-pay | Attending: Emergency Medicine | Admitting: Emergency Medicine

## 2021-12-25 DIAGNOSIS — W01198A Fall on same level from slipping, tripping and stumbling with subsequent striking against other object, initial encounter: Secondary | ICD-10-CM | POA: Insufficient documentation

## 2021-12-25 DIAGNOSIS — W19XXXA Unspecified fall, initial encounter: Secondary | ICD-10-CM

## 2021-12-25 DIAGNOSIS — Y92522 Railway station as the place of occurrence of the external cause: Secondary | ICD-10-CM | POA: Insufficient documentation

## 2021-12-25 DIAGNOSIS — S20211A Contusion of right front wall of thorax, initial encounter: Secondary | ICD-10-CM

## 2021-12-25 DIAGNOSIS — M25562 Pain in left knee: Secondary | ICD-10-CM | POA: Insufficient documentation

## 2021-12-25 DIAGNOSIS — S40011A Contusion of right shoulder, initial encounter: Secondary | ICD-10-CM

## 2021-12-25 DIAGNOSIS — M79651 Pain in right thigh: Secondary | ICD-10-CM | POA: Insufficient documentation

## 2021-12-25 DIAGNOSIS — S0990XA Unspecified injury of head, initial encounter: Secondary | ICD-10-CM

## 2021-12-25 MED ORDER — IBUPROFEN 400 MG PO TABS: 400.0000 mg | ORAL_TABLET | Freq: Once | ORAL | Status: DC

## 2021-12-25 MED ORDER — LIDOCAINE 5 % EX PTCH
1.0000 | MEDICATED_PATCH | CUTANEOUS | Status: DC
  Administered 2021-12-25: 1 via TRANSDERMAL
  Filled 2021-12-25: qty 1

## 2021-12-25 MED ORDER — ACETAMINOPHEN 500 MG PO TABS
1000.0000 mg | ORAL_TABLET | Freq: Once | ORAL | Status: AC
  Administered 2021-12-25: 1000 mg via ORAL
  Filled 2021-12-25: qty 2

## 2021-12-25 NOTE — ED Triage Notes (Signed)
a 

## 2021-12-25 NOTE — ED Provider Notes (Signed)
[facility] EMERGENCY DEPARTMENT Provider Note   CSN: [id number] Arrival date & time: 12/25/21  1536     History  Chief Complaint  Patient presents with   Fall    [name] is a 61 y.o. female.  HPI Patient reports she was stepping off of the train carrying a couple suitcases and lost her balance causing her to fall onto the platform and then fall between the stationary train in the platform.  She reports that she fell backwards and she did hit the back of her head but did not get knocked out.  She reports she does not really have pain at the back of her head but has a slight frontal headache.  No loss of consciousness or confusion.  No neck pain.  She reports she does have a lot of pain in the right shoulder and on the right side of her chest.  No difficulty breathing.  No abdominal pain.  No weakness numbness or tingling to her arms or her legs.  Patient reports she did stand to ambulate to transition over to a bench when she was in the train station.  She did not note any weakness or numbness to her legs.  She reports the left knee is a little sore and there is a sore spot on her right thigh.  Patient does not take any blood thinners.  She reports she takes her antihypertension medication as prescribed.    Home Medications Prior to Admission medications   Not on File      Allergies    Tylenol [acetaminophen]    Review of Systems   Review of Systems  Physical Exam Updated Vital Signs BP (!) 145/53   Pulse (!) 58   Temp 98 F (36.7 C) (Oral)   Resp 18   Ht 5\' 4"  (1.626 m)   Wt 54 kg   SpO2 98%   BMI 20.43 kg/m  Physical Exam Constitutional:      Comments: Alert nontoxic.  GCS 15.  No acute distress.  No respiratory distress.  Well-nourished well-developed.  HENT:     Head:     Comments: Slightly tender area to the posterior scalp but no laceration or hematoma.  No facial trauma.    Mouth/Throat:     Mouth: Mucous membranes are moist.      Pharynx: Oropharynx is clear.  Eyes:     Extraocular Movements: Extraocular movements intact.     Pupils: Pupils are equal, round, and reactive to light.  Neck:     Comments: No midline C-spine tenderness.  Some tenderness over the right trapezius and point of the right shoulder. Cardiovascular:     Rate and Rhythm: Normal rate and regular rhythm.  Pulmonary:     Effort: Pulmonary effort is normal.     Breath sounds: Normal breath sounds.     Comments: Tenderness right chest wall.  Approximately ribs 7 through 9.  No crepitus or visible abrasion or hematoma at this time. Abdominal:     General: There is no distension.     Palpations: Abdomen is soft.     Tenderness: There is no abdominal tenderness. There is no guarding.     Comments: No tenderness of the abdomen.  No tenderness in the right or left upper quadrant.  Musculoskeletal:     Comments: Patient has a very superficial abrasion to the posterior right shoulder.  About 1 cm no bleeding.  No visible deformity.  Tenderness over the point of the  shoulder.  Patient does have intact spontaneous range of motion she can spontaneously move the arm to point out other areas of discomfort.  No deformities of the knees or the lower legs.  No swelling at the ankles.  Skin:    General: Skin is warm and dry.  Neurological:     General: No focal deficit present.     Mental Status: She is oriented to person, place, and time.     Motor: No weakness.     Coordination: Coordination normal.  Psychiatric:        Mood and Affect: Mood normal.     ED Results / Procedures / Treatments   Labs (all labs ordered are listed, but only abnormal results are displayed) Labs Reviewed  URINALYSIS, ROUTINE W REFLEX MICROSCOPIC    EKG None  Radiology DG Shoulder Right  Result Date: 12/25/2021 CLINICAL DATA:  Recent fall with shoulder pain, initial encounter EXAM: RIGHT SHOULDER - 2+ VIEW COMPARISON:  None Available. FINDINGS: Degenerative changes of the  acromioclavicular joint are seen. No acute fracture or dislocation is noted. No soft tissue abnormality is seen. IMPRESSION: No acute abnormality noted. Electronically Signed   By: Alcide Clever M.D.   On: 12/25/2021 19:13   DG Chest 2 View  Result Date: 12/25/2021 CLINICAL DATA:  Shortness of breath EXAM: CHEST - 2 VIEW COMPARISON:  02/03/2011 FINDINGS: Cardiac shadow is within normal limits. Lungs are well aerated bilaterally. Minimal scarring is noted in the bases bilaterally. No focal infiltrate or sizable effusion is seen. No bony abnormality is noted. IMPRESSION: No acute abnormality noted. Electronically Signed   By: Alcide Clever M.D.   On: 12/25/2021 19:08    Procedures Procedures    Medications Ordered in ED Medications  lidocaine (LIDODERM) 5 % 1 patch (has no administration in time range)  acetaminophen (TYLENOL) tablet 1,000 mg (has no administration in time range)    ED Course/ Medical Decision Making/ A&P                           Medical Decision Making Amount and/or Complexity of Data Reviewed Labs: ordered. Radiology: ordered.  Risk OTC drugs. Prescription drug management.  Patient had mechanical fall from standing height as outlined.  She did however fall between the stationary train in the platform.  Patient did not have loss of consciousness.  She reports she did strike the back of her head but has minimal headache and no confusion, no laceration or significant hematoma at this time.  At this time, with the patient not on blood thinners and no loss of consciousness and mild frontal headache I do not think that CT scan of the head is indicated.  No midline C-spine tenderness or neurologic symptoms of paresthesia or numbness.  At this time I do not think that CT spine CT is indicated.  Patient does have tenderness and minor abrasion over the right shoulder.  Range of motion is intact but we will proceed with x-rays of the right shoulder.  Also some tenderness on the right  chest wall.  Will obtain x-rays.  Patient has symmetric breath sounds and no respiratory distress.  She does not have any pain of the abdomen either in the right or left upper abdomen to suggest solid organ injury.  This time I do not think that CT scan of the chest or abdomen is indicated.  Chest x-ray and shoulder x-ray reviewed by radiology negative for any acute fracture  or other appearance of traumatic injury.  I have reassessed the patient.  She is in good condition.  She is alert with no distress.  At this time, no significant injuries of head chest or abdomen identified.  I feel patient is stable for symptomatic treatment of multiple contusions and possibly shoulder strain as well.  We discussed the listed acetaminophen allergy.  Patient denies any known allergy to acetaminophen.  She reports maybe something a long time ago but she does not think it was significant.  At this time with the patient having history of hypertension and taking antihypertensive medications she will preferentially use Tylenol as needed for pain control.        Final Clinical Impression(s) / ED Diagnoses Final diagnoses:  Fall, initial encounter  Contusion of right shoulder, initial encounter  Contusion of right chest wall, initial encounter  Minor head injury, initial encounter    Rx / DC Orders ED Discharge Orders     None         Arby Barrette, MD 12/25/21 2027

## 2021-12-25 NOTE — Discharge Instructions (Addendum)
1.  You may have many areas of stiffness and soreness from your fall.  At this time an x-ray was done on your right shoulder and your chest.  No broken bones were identified in your shoulder or your ribs.  Sometimes there are very fine cracks in the ribs that do not show up on a chest x-ray.  There can be quite a bit of pain from bruising and muscle strain.  Apply well wrapped ice packs to areas of pain and swelling for the first 48 hours.  After that, warm moist heat may feel better.  You may also apply over-the-counter pain patches in the sore areas. 2.  Take extra strength over-the-counter Tylenol every 6 hours as prescribed for pain as needed.  Due to your high blood pressure, avoid ibuprofen\Aleve\Motrin\naproxen.  These medications can sometimes worsen high blood pressure.  They can be safe to take in small doses with close supervision of your physician. 3.  If you develop significant worsening symptoms such as severe headache, confusion, vomiting, difficulty breathing or significantly increasing chest pain, weakness or numbness, return to the emergency department for recheck immediately.  Otherwise, schedule a follow-up check with your doctor in the next 3 to 5 days.

## 2021-12-25 NOTE — ED Triage Notes (Signed)
The pt arrived by  [employer]  she fell getting off the  train   she fell and struck her rt shoulder on the steps  she also has abrasion on her rt side  no loc   pain and swelling  rt shoulder  she is also c/o a headache where she struck her head
